# Patient Record
Sex: Male | Born: 2000 | Race: Black or African American | Hispanic: No | Marital: Single | Smoking: Never smoker
Health system: Southern US, Community
[De-identification: ages and names within clinical notes are randomized; demographics above are authoritative.]

---
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
## 2023-02-25 ENCOUNTER — Emergency Department: Payer: Medicaid Other

## 2023-02-25 ENCOUNTER — Emergency Department
Admission: EM | Admit: 2023-02-25 | Discharge: 2023-02-25 | Disposition: A | Discharge status: Home / Self Care | Payer: Medicaid Other | Attending: Emergency Medicine | Admitting: Emergency Medicine

## 2023-02-25 ENCOUNTER — Other Ambulatory Visit: Payer: Self-pay

## 2023-02-25 ENCOUNTER — Encounter: Payer: Self-pay | Admitting: Emergency Medicine

## 2023-02-25 ENCOUNTER — Telehealth: Payer: Self-pay | Admitting: Emergency Medicine

## 2023-02-25 DIAGNOSIS — R1030 Lower abdominal pain, unspecified: Secondary | ICD-10-CM

## 2023-02-25 DIAGNOSIS — R1031 Right lower quadrant pain: Secondary | ICD-10-CM | POA: Insufficient documentation

## 2023-02-25 DIAGNOSIS — Z202 Contact with and (suspected) exposure to infections with a predominantly sexual mode of transmission: Secondary | ICD-10-CM | POA: Insufficient documentation

## 2023-02-25 DIAGNOSIS — R8271 Bacteriuria: Secondary | ICD-10-CM | POA: Insufficient documentation

## 2023-02-25 LAB — CBC WITH DIFFERENTIAL/PLATELET
Abs Immature Granulocytes: 0.02 10*3/uL (ref 0.00–0.07)
Basophils Absolute: 0 10*3/uL (ref 0.0–0.1)
Basophils Relative: 0 %
Eosinophils Absolute: 0.1 10*3/uL (ref 0.0–0.5)
Eosinophils Relative: 1 %
HCT: 43.8 % (ref 39.0–52.0)
Hemoglobin: 14.1 g/dL (ref 13.0–17.0)
Immature Granulocytes: 0 %
Lymphocytes Relative: 20 %
Lymphs Abs: 1.9 10*3/uL (ref 0.7–4.0)
MCH: 29 pg (ref 26.0–34.0)
MCHC: 32.2 g/dL (ref 30.0–36.0)
MCV: 89.9 fL (ref 80.0–100.0)
Monocytes Absolute: 0.9 10*3/uL (ref 0.1–1.0)
Monocytes Relative: 9 %
Neutro Abs: 6.8 10*3/uL (ref 1.7–7.7)
Neutrophils Relative %: 70 %
Platelets: 473 10*3/uL — ABNORMAL HIGH (ref 150–400)
RBC: 4.87 MIL/uL (ref 4.22–5.81)
RDW: 12.4 % (ref 11.5–15.5)
WBC: 9.7 10*3/uL (ref 4.0–10.5)
nRBC: 0 % (ref 0.0–0.2)

## 2023-02-25 LAB — COMPREHENSIVE METABOLIC PANEL
ALT: 22 U/L (ref 0–44)
AST: 23 U/L (ref 15–41)
Albumin: 4 g/dL (ref 3.5–5.0)
Alkaline Phosphatase: 92 U/L (ref 38–126)
Anion gap: 9 (ref 5–15)
BUN: 10 mg/dL (ref 6–20)
CO2: 27 mmol/L (ref 22–32)
Calcium: 9 mg/dL (ref 8.9–10.3)
Chloride: 100 mmol/L (ref 98–111)
Creatinine, Ser: 1.06 mg/dL (ref 0.61–1.24)
GFR, Estimated: >60 mL/min (ref >60)
Glucose, Bld: 118 mg/dL — ABNORMAL HIGH (ref 70–99)
Potassium: 3.9 mmol/L (ref 3.5–5.1)
Sodium: 136 mmol/L (ref 135–145)
Total Bilirubin: 0.4 mg/dL (ref 0.3–1.2)
Total Protein: 9.1 g/dL — ABNORMAL HIGH (ref 6.5–8.1)

## 2023-02-25 LAB — URINALYSIS, MICROSCOPIC (REFLEX): WBC, UA: >50 WBC/hpf (ref 0–5)

## 2023-02-25 LAB — URINALYSIS, ROUTINE W REFLEX MICROSCOPIC
Bilirubin Urine: NEGATIVE
Glucose, UA: NEGATIVE mg/dL
Ketones, ur: NEGATIVE mg/dL
Nitrite: NEGATIVE
Specific Gravity, Urine: 1.025 (ref 1.005–1.030)
pH: 6 (ref 5.0–8.0)

## 2023-02-25 LAB — CHLAMYDIA/NGC RT PCR (ARMC ONLY)
Chlamydia Tr: DETECTED — AB
N gonorrhoeae: DETECTED — AB

## 2023-02-25 LAB — LIPASE, BLOOD: Lipase: 24 U/L (ref 11–51)

## 2023-02-25 MED ORDER — LIDOCAINE 5 % EX PTCH: 1.0000 | MEDICATED_PATCH | CUTANEOUS | Status: DC

## 2023-02-25 MED ORDER — IOHEXOL 300 MG/ML  SOLN
100.0000 mL | Freq: Once | INTRAMUSCULAR | Status: AC | PRN
  Administered 2023-02-25: 100 mL via INTRAVENOUS

## 2023-02-25 MED ORDER — CEFTRIAXONE SODIUM 1 G IJ SOLR
500.0000 mg | Freq: Once | INTRAMUSCULAR | Status: AC
  Administered 2023-02-25: 500 mg via INTRAMUSCULAR
  Filled 2023-02-25: qty 10

## 2023-02-25 MED ORDER — DOXYCYCLINE MONOHYDRATE 100 MG PO TABS: 100.0000 mg | ORAL_TABLET | Freq: Two times a day (BID) | ORAL | 0 refills | Status: AC

## 2023-02-25 NOTE — ED Triage Notes (Signed)
Pt c/o RLQ abd pain for one week. Pt denies N/V/D, fevers.

## 2023-02-25 NOTE — ED Provider Notes (Signed)
[facility] Provider Note    Event Date/Time   First MD Initiated Contact with Patient 02/25/23 1016     (approximate)   History   Abdominal Pain   HPI  Glen Neal is a 22 y.o. male who presents today for evaluation of right lower quadrant pain that has been intermittent for the past 1 week.  He notices that when he sits up from laying down or when he walks.  It does not radiate.  He does not have any associated nausea, vomiting, or diarrhea.  Has not had any hematuria or dysuria.  No fevers or chills.  No history of abdominal surgery.  No appetite change, however he did not have anything to eat or drink today.  There are no problems to display for this patient.         Physical Exam   Triage Vital Signs: ED Triage Vitals  Encounter Vitals Group     BP 02/25/23 1013 (!) 140/95     Systolic BP Percentile --      Diastolic BP Percentile --      Pulse Rate 02/25/23 1013 72     Resp 02/25/23 1013 16     Temp 02/25/23 1013 98.2 F (36.8 C)     Temp Source 02/25/23 1013 Oral     SpO2 02/25/23 1013 99 %     Weight 02/25/23 1012 165 lb (74.8 kg)     Height 02/25/23 1012 6\' 5"  (1.956 m)     Head Circumference --      Peak Flow --      Pain Score 02/25/23 1012 3     Pain Loc --      Pain Education --      Exclude from Growth Chart --     Most recent vital signs: Vitals:   02/25/23 1323 02/25/23 1430  BP: 138/88 130/80  Pulse: 70 70  Resp: 16 16  Temp:  98 F (36.7 C)  SpO2: 99% 99%    Physical Exam Vitals and nursing note reviewed.  Constitutional:      General: Awake and alert. No acute distress.    Appearance: Normal appearance. The patient is normal weight.  HENT:     Head: Normocephalic and atraumatic.     Mouth: Mucous membranes are moist.  Eyes:     General: PERRL. Normal EOMs        Right eye: No discharge.        Left eye: No discharge.     Conjunctiva/sclera: Conjunctivae normal.  Cardiovascular:     Rate and  Rhythm: Normal rate and regular rhythm.     Pulses: Normal pulses.  Pulmonary:     Effort: Pulmonary effort is normal. No respiratory distress.     Breath sounds: Normal breath sounds.  Abdominal:     Abdomen is soft. There is no abdominal tenderness. No rebound or guarding. No distention.  Negative Rovsing's, psoas, and obturator signs GU exam: normal testicles and penis. No swelling or erythema. No lesions Musculoskeletal:        General: No swelling. Normal range of motion.     Cervical back: Normal range of motion and neck supple.  Skin:    General: Skin is warm and dry.     Capillary Refill: Capillary refill takes less than 2 seconds.     Findings: No rash.  Neurological:     Mental Status: The patient is awake and alert.  ED Results / Procedures / Treatments   Labs (all labs ordered are listed, but only abnormal results are displayed) Labs Reviewed  COMPREHENSIVE METABOLIC PANEL - Abnormal; Notable for the following components:      Result Value   Glucose, Bld 118 (*)    Total Protein 9.1 (*)    All other components within normal limits  URINALYSIS, ROUTINE W REFLEX MICROSCOPIC - Abnormal; Notable for the following components:   APPearance HAZY (*)    Hgb urine dipstick TRACE (*)    Protein, ur TRACE (*)    Leukocytes,Ua SMALL (*)    All other components within normal limits  CBC WITH DIFFERENTIAL/PLATELET - Abnormal; Notable for the following components:   Platelets 473 (*)    All other components within normal limits  URINALYSIS, MICROSCOPIC (REFLEX) - Abnormal; Notable for the following components:   Bacteria, UA MANY (*)    All other components within normal limits  CHLAMYDIA/NGC RT PCR ([facility] ONLY)            LIPASE, BLOOD  HIV ANTIBODY (ROUTINE TESTING W REFLEX)  RPR     EKG     RADIOLOGY I independently reviewed and interpreted imaging and agree with radiologists findings.     PROCEDURES:  Critical Care performed:    Procedures   MEDICATIONS ORDERED IN ED: Medications  iohexol (OMNIPAQUE) 300 MG/ML solution 100 mL (100 mLs Intravenous Contrast Given 02/25/23 1106)  cefTRIAXone (ROCEPHIN) injection 500 mg (500 mg Intramuscular Given 02/25/23 1347)     IMPRESSION / MDM / ASSESSMENT AND PLAN / ED COURSE  I reviewed the triage vital signs and the nursing notes.   Differential diagnosis includes, but is not limited to, appendicitis, nephrolithiasis, UTI, pyelonephritis.  Patient is awake and alert, hemodynamically stable and afebrile.  He is nontoxic in appearance.  I reviewed the patient's chart.  He has not had any recent visits.  Further workup is indicated.  Labs are obtained in triage.  However, given the possibility of appendicitis given the location of his discomfort, will obtain CT abdomen and pelvis.  He declined analgesia and antiemetics at this time.   Labs are overall reassuring.  Urinalysis reveals greater than 50 WBCs and bacteria.  CAT scan is negative for any acute findings.  Upon reevaluation, patient reports that he was sexually active last week and did not use protection and his partner was relatively unknown to him.  He is unsure if she is experiencing symptoms as well.  Testicular exam with chaperone was normal.  He requests to be treated empirically rather than wait for the results.  He understands that the antibiotic will need to be changed if his gonorrhea/chlamydia test is negative.  He does not wish to wait for this result today.  Patient was advised that there are many other STDs that patient could have, that we do not test for in the emergency department. Patient was advised to follow up with a primary care doctor to have the full panel of testing performed. Patient was advised to not have sexual intercourse until fully and properly tested and treated. Patient was advised that his partner also needs to be tested and treated. Patient understands and agrees with plan.   Patient's  presentation is most consistent with acute complicated illness / injury requiring diagnostic workup.   Clinical Course as of 02/25/23 1432  Thu Feb 25, 2023  1317 On reevaluation, patient does report that he had unprotected intercourse last week [JP]    Clinical Course  User Index [JP] Maddilynn Esperanza, Herb Grays, PA-C     FINAL CLINICAL IMPRESSION(S) / ED DIAGNOSES   Final diagnoses:  Bacteriuria  Exposure to STD  Lower abdominal pain     Rx / DC Orders   ED Discharge Orders          Ordered    doxycycline (ADOXA) 100 MG tablet  2 times daily        02/25/23 1420             Note:  This document was prepared using Dragon voice recognition software and may include unintentional dictation errors.   Jackelyn Hoehn, PA-C 02/25/23 1432    Concha Se, MD 03/02/23 [phone]

## 2023-02-25 NOTE — Telephone Encounter (Signed)
Attempted to call patient to notify him of positive results. Pts line was busy. Will attempted at another time.

## 2023-02-25 NOTE — Discharge Instructions (Addendum)
Please take the antibiotics as prescribed.  You have been treated empirically once you finish these antibiotics for gonorrhea and chlamydia.  You may find the results of your syphilis and HIV test on MyChart.  Remember that there are many other STDs that and you should follow up with a primary care doctor to have the full panel of testing performed. Please do not have sexual intercourse until fully and properly tested and treated. Your partner also needs to be tested and treated.  Please return for any new, worsening, or change in symptoms or other concerns.  It was a pleasure caring for you today.

## 2023-02-26 LAB — HIV ANTIBODY (ROUTINE TESTING W REFLEX): HIV Screen 4th Generation wRfx: NONREACTIVE

## 2023-02-26 LAB — RPR: RPR Ser Ql: NONREACTIVE

## 2023-02-27 NOTE — Plan of Care (Signed)
CH[facility] Tonsillectomy/Adenoidectomy, Postoperative PEDS care plan entered in error.

## 2023-03-01 NOTE — ED Notes (Signed)
Attempted to call patient to notify of positive Gonorrhea and Chlamydia results without success.
